# Patient Record
Sex: Female | Born: 1960 | Race: White | Hispanic: No | Smoking: Never smoker
Health system: Southern US, Community
[De-identification: ages and names within clinical notes are randomized; demographics above are authoritative.]

---

## 1999-10-25 ENCOUNTER — Encounter: Payer: Self-pay | Admitting: Obstetrics & Gynecology

## 1999-10-25 ENCOUNTER — Encounter: Admission: RE | Admit: 1999-10-25 | Discharge: 1999-10-25 | Payer: Self-pay | Admitting: Obstetrics & Gynecology

## 1999-12-19 ENCOUNTER — Other Ambulatory Visit: Admission: RE | Admit: 1999-12-19 | Discharge: 1999-12-19 | Payer: Self-pay | Admitting: Obstetrics & Gynecology

## 2001-08-27 ENCOUNTER — Encounter: Payer: Self-pay | Admitting: Family Medicine

## 2001-08-27 ENCOUNTER — Encounter: Admission: RE | Admit: 2001-08-27 | Discharge: 2001-08-27 | Payer: Self-pay | Admitting: Family Medicine

## 2004-07-06 ENCOUNTER — Ambulatory Visit: Payer: Self-pay | Admitting: Family Medicine

## 2004-12-21 ENCOUNTER — Ambulatory Visit: Payer: Self-pay | Admitting: Internal Medicine

## 2005-01-10 ENCOUNTER — Ambulatory Visit: Payer: Self-pay | Admitting: Family Medicine

## 2005-01-30 ENCOUNTER — Ambulatory Visit: Payer: Self-pay | Admitting: Family Medicine

## 2006-09-15 ENCOUNTER — Other Ambulatory Visit: Payer: Self-pay

## 2006-09-15 ENCOUNTER — Emergency Department: Payer: Self-pay | Admitting: Emergency Medicine

## 2006-12-26 ENCOUNTER — Telehealth (INDEPENDENT_AMBULATORY_CARE_PROVIDER_SITE_OTHER): Payer: Self-pay | Admitting: *Deleted

## 2007-12-23 ENCOUNTER — Telehealth (INDEPENDENT_AMBULATORY_CARE_PROVIDER_SITE_OTHER): Payer: Self-pay | Admitting: *Deleted

## 2010-01-23 ENCOUNTER — Ambulatory Visit (HOSPITAL_COMMUNITY)
Admission: RE | Admit: 2010-01-23 | Discharge: 2010-01-23 | Payer: Self-pay | Source: Home / Self Care | Attending: Internal Medicine | Admitting: Internal Medicine

## 2011-02-01 ENCOUNTER — Other Ambulatory Visit (HOSPITAL_COMMUNITY): Payer: Self-pay | Admitting: Family Medicine

## 2011-02-01 DIAGNOSIS — Z1231 Encounter for screening mammogram for malignant neoplasm of breast: Secondary | ICD-10-CM

## 2011-03-05 ENCOUNTER — Ambulatory Visit (HOSPITAL_COMMUNITY)
Admission: RE | Admit: 2011-03-05 | Discharge: 2011-03-05 | Disposition: A | Discharge status: Home / Self Care | Payer: Self-pay | Source: Ambulatory Visit | Attending: Family Medicine | Admitting: Family Medicine

## 2011-03-05 ENCOUNTER — Other Ambulatory Visit (HOSPITAL_COMMUNITY): Payer: Self-pay | Admitting: Internal Medicine

## 2011-03-05 DIAGNOSIS — Z1231 Encounter for screening mammogram for malignant neoplasm of breast: Secondary | ICD-10-CM

## 2012-02-20 ENCOUNTER — Emergency Department (HOSPITAL_COMMUNITY)
Admission: EM | Admit: 2012-02-20 | Discharge: 2012-02-20 | Disposition: A | Discharge status: Home / Self Care | Payer: Self-pay | Source: Home / Self Care | Attending: Family Medicine | Admitting: Family Medicine

## 2012-02-20 ENCOUNTER — Encounter (HOSPITAL_COMMUNITY): Payer: Self-pay

## 2012-02-20 DIAGNOSIS — R002 Palpitations: Secondary | ICD-10-CM

## 2012-02-20 DIAGNOSIS — M62838 Other muscle spasm: Secondary | ICD-10-CM

## 2012-02-20 DIAGNOSIS — M1990 Unspecified osteoarthritis, unspecified site: Secondary | ICD-10-CM

## 2012-02-20 DIAGNOSIS — I10 Essential (primary) hypertension: Secondary | ICD-10-CM

## 2012-02-20 DIAGNOSIS — Z23 Encounter for immunization: Secondary | ICD-10-CM

## 2012-02-20 LAB — COMPREHENSIVE METABOLIC PANEL
Albumin: 3.9 g/dL (ref 3.5–5.2)
BUN: 13 mg/dL (ref 6–23)
Creatinine, Ser: 0.64 mg/dL (ref 0.50–1.10)
GFR calc Af Amer: >90 mL/min (ref >90)
Glucose, Bld: 88 mg/dL (ref 70–99)
Total Bilirubin: 0.4 mg/dL (ref 0.3–1.2)
Total Protein: 7.2 g/dL (ref 6.0–8.3)

## 2012-02-20 LAB — CBC
Platelets: 230 10*3/uL (ref 150–400)
RDW: 13.3 % (ref 11.5–15.5)
WBC: 8.3 10*3/uL (ref 4.0–10.5)

## 2012-02-20 LAB — SEDIMENTATION RATE: Sed Rate: 27 mm/hr — ABNORMAL HIGH (ref 0–22)

## 2012-02-20 LAB — POCT URINALYSIS DIP (DEVICE)
Glucose, UA: NEGATIVE mg/dL
Ketones, ur: 15 mg/dL — AB
Leukocytes, UA: NEGATIVE
Specific Gravity, Urine: 1.01 (ref 1.005–1.030)
Urobilinogen, UA: 0.2 mg/dL (ref 0.0–1.0)

## 2012-02-20 LAB — URIC ACID: Uric Acid, Serum: 4.6 mg/dL (ref 2.4–7.0)

## 2012-02-20 LAB — TSH: TSH: 0.599 u[IU]/mL (ref 0.350–4.500)

## 2012-02-20 LAB — HEMOGLOBIN A1C: Mean Plasma Glucose: 111 mg/dL (ref <117)

## 2012-02-20 MED ORDER — INFLUENZA VIRUS VACC SPLIT PF IM SUSP
0.5000 mL | Freq: Once | INTRAMUSCULAR | Status: AC
  Administered 2012-02-20: 0.5 mL via INTRAMUSCULAR

## 2012-02-20 MED ORDER — MELOXICAM 15 MG PO TABS: 15.0000 mg | ORAL_TABLET | Freq: Every day | ORAL | Status: DC

## 2012-02-20 MED ORDER — HYDROCHLOROTHIAZIDE 12.5 MG PO TABS: 12.5000 mg | ORAL_TABLET | Freq: Every day | ORAL | Status: AC

## 2012-02-20 MED ORDER — NORGESTIMATE-ETH ESTRADIOL 0.25-35 MG-MCG PO TABS: 1.0000 | ORAL_TABLET | Freq: Every day | ORAL | Status: AC

## 2012-02-20 NOTE — ED Notes (Signed)
Patient states has pain in both hands for past two months. Has been taking meloxicam but makes her feel bloated, feels like she is gaining weight from medication

## 2012-02-20 NOTE — ED Provider Notes (Signed)
History   CSN: [id number] Arrival date & time 02/20/12  1215  First MD Initiated Contact with Patient 02/20/12 1301     Chief Complaint  Patient presents with  . Hand Pain   HPI Pt reports that she has been having some PALPATATIONS.   Pt says that she needs refills of her Sprintec OCPs.   She takes them for regulating menstrual cycles.  She will see her GYN in a couple of months.  She reports she has been having palpatations regularly lately.  She had an ECG with her PCP at [facility] recently reported as normal.  Symptoms persist.  Pt has had arthritis in both hands and nodules have developed and have been painful.  Pt would like to have her arthritis labs checked because she could not afford to have them checked with her PCP.   Pt is taking meloxicam and reports that she is able to function better on the medication but just takes the edge off of her symptoms of arthritis pain.     History reviewed. No pertinent past medical history.  History reviewed. No pertinent past surgical history.  No family history on file.  History  Substance Use Topics  . Smoking status: Never Smoker   . Smokeless tobacco: Not on file  . Alcohol Use: No    OB History    Grav Para Term Preterm Abortions TAB SAB Ect Mult Living                 Review of Systems  Cardiovascular: Positive for palpitations.  Genitourinary: Positive for urgency and frequency.  Musculoskeletal: Positive for joint swelling and arthralgias.  All other systems reviewed and are negative.   Allergies  Review of patient's allergies indicates no known allergies.  Home Medications   Current Outpatient Rx  Name  Route  Sig  Dispense  Refill  . MELOXICAM 15 MG PO TABS   Oral   Take 15 mg by mouth daily.          BP 146/72  Pulse 53  Temp 98.1 F (36.7 C) (Oral)  Resp 18  SpO2 99%  Physical Exam  Nursing note and vitals reviewed. Constitutional: She is oriented to person, place, and time. She appears  well-developed and well-nourished. No distress.  HENT:  Head: Normocephalic and atraumatic.  Eyes: EOM are normal. Pupils are equal, round, and reactive to light.  Neck: Normal range of motion. Neck supple. No thyromegaly present.  Cardiovascular: Normal rate, regular rhythm, normal heart sounds and intact distal pulses.  Exam reveals no gallop and no friction rub.   No murmur heard. Pulmonary/Chest: Effort normal and breath sounds normal. No respiratory distress. She has no wheezes.  Abdominal: Soft. Bowel sounds are normal. She exhibits no distension.  Musculoskeletal: She exhibits edema and tenderness.       Left hand: She exhibits tenderness, deformity and swelling. normal sensation noted. Normal strength noted.       Hands: Lymphadenopathy:    She has no cervical adenopathy.  Neurological: She is alert and oriented to person, place, and time.  Skin: Skin is warm and dry. No rash noted. No erythema.  Psychiatric: She has a normal mood and affect. Her behavior is normal. Judgment and thought content normal.    ED Course  Procedures (including critical care time)  Labs Reviewed - No data to display No results found.  No diagnosis found.  MDM  IMPRESSION  palpitations  Osteoarthritis hands  Hypertension  Dysuria (urgency  and frequency)  RECOMMENDATIONS / PLAN HCTZ 12.5 mg po daily Check labs including TSH, ESR, CMP, A1c, check ECG today Check urinalysis  Check ECG Follow Labs Continue Mobic 15 mg po daily  Counseling and education provided Refilled her Sprintec prescription but advised her to make sure and follow up with her gynecologist in the next month as she has scheduled Recheck blood pressure at home 1-2 times daily right down readings and bring to next office visit.  Call us with blood pressure readings in 2 weeks.  FOLLOW UP 2 months   The patient was given clear instructions to go to ER or return to medical center if symptoms don't improve, worsen or new  problems develop.  The patient verbalized understanding.  The patient was told to call to get lab results if they haven't heard anything in the next week.            Cleora Fleet, MD 02/20/12 1436

## 2012-02-21 NOTE — Progress Notes (Signed)
Quick Note:  Please notify patient that her labs came back okay.    Rodney Langton, MD, CDE, FAAFP [facility] [place], [place]   ______

## 2012-02-24 ENCOUNTER — Other Ambulatory Visit (HOSPITAL_COMMUNITY): Payer: Self-pay | Admitting: Internal Medicine

## 2012-02-24 DIAGNOSIS — Z1231 Encounter for screening mammogram for malignant neoplasm of breast: Secondary | ICD-10-CM

## 2012-02-25 ENCOUNTER — Telehealth (HOSPITAL_COMMUNITY): Payer: Self-pay

## 2012-02-25 NOTE — Telephone Encounter (Signed)
Message copied by Lestine Mount on Tue Feb 25, 2012 11:07 AM ------      Message from: Cleora Fleet      Created: Fri Feb 21, 2012  9:51 AM       Please notify patient that her labs came back okay.                        Rodney Langton, MD, CDE, FAAFP      [facility]      [facility]      [place], [place]

## 2012-03-10 ENCOUNTER — Encounter (HOSPITAL_COMMUNITY): Payer: Self-pay

## 2012-03-10 ENCOUNTER — Ambulatory Visit (HOSPITAL_COMMUNITY)
Admission: RE | Admit: 2012-03-10 | Discharge: 2012-03-10 | Disposition: A | Discharge status: Home / Self Care | Payer: Self-pay | Source: Ambulatory Visit | Attending: Internal Medicine | Admitting: Internal Medicine

## 2012-03-10 ENCOUNTER — Emergency Department (HOSPITAL_COMMUNITY)
Admission: EM | Admit: 2012-03-10 | Discharge: 2012-03-10 | Disposition: A | Discharge status: Home / Self Care | Payer: Self-pay | Source: Home / Self Care | Attending: Family Medicine | Admitting: Family Medicine

## 2012-03-10 DIAGNOSIS — J309 Allergic rhinitis, unspecified: Secondary | ICD-10-CM

## 2012-03-10 DIAGNOSIS — M1990 Unspecified osteoarthritis, unspecified site: Secondary | ICD-10-CM

## 2012-03-10 DIAGNOSIS — Z1231 Encounter for screening mammogram for malignant neoplasm of breast: Secondary | ICD-10-CM | POA: Insufficient documentation

## 2012-03-10 DIAGNOSIS — M2550 Pain in unspecified joint: Secondary | ICD-10-CM

## 2012-03-10 DIAGNOSIS — I10 Essential (primary) hypertension: Secondary | ICD-10-CM

## 2012-03-10 MED ORDER — OMEPRAZOLE 20 MG PO TBEC: 1.0000 | DELAYED_RELEASE_TABLET | Freq: Every morning | ORAL | Status: AC

## 2012-03-10 MED ORDER — LORATADINE 10 MG PO TABS: 10.0000 mg | ORAL_TABLET | Freq: Every day | ORAL | Status: AC

## 2012-03-10 MED ORDER — MELOXICAM 7.5 MG PO TABS: 7.5000 mg | ORAL_TABLET | Freq: Every day | ORAL | Status: AC

## 2012-03-10 NOTE — ED Notes (Signed)
Patient states here for follow up needs referral to a specialist

## 2012-03-10 NOTE — ED Provider Notes (Signed)
History   CSN: [id number]  Arrival date & time 03/10/12  1244  First MD Initiated Contact with Patient 03/10/12 1309     Chief Complaint  Patient presents with  . Follow-up   HPI Pt presenting to follow up for her arthritis condition.   She says that she stopped taking the mobic for about 4 days and she had a terrible exacerbation of her joint swelling and pain in the hands.  The patient says that she is noticing that she is getting more and more ulcers in the mouth and reports that she is having some abdominal discomfort but has improved with time. She is requesting that she be evaluated by a rheumatologist. She says she will have [employer] next month and would like to have a referral to have this evaluated.   History reviewed. No pertinent past medical history.  History reviewed. No pertinent past surgical history.  No family history on file.  History  Substance Use Topics  . Smoking status: Never Smoker   . Smokeless tobacco: Not on file  . Alcohol Use: No    OB History   Grav Para Term Preterm Abortions TAB SAB Ect Mult Living                 Review of Systems  Gastrointestinal: Positive for nausea. Negative for abdominal pain, blood in stool and anal bleeding.  Musculoskeletal: Positive for joint swelling and arthralgias.  All other systems reviewed and are negative.    Allergies  Review of patient's allergies indicates no known allergies.  Home Medications   Current Outpatient Rx  Name  Route  Sig  Dispense  Refill  . hydrochlorothiazide (HYDRODIURIL) 12.5 MG tablet   Oral   Take 1 tablet (12.5 mg total) by mouth daily.   30 tablet   3   . loratadine (CLARITIN) 10 MG tablet   Oral   Take 1 tablet (10 mg total) by mouth daily.   30 tablet   1   . meloxicam (MOBIC) 7.5 MG tablet   Oral   Take 1 tablet (7.5 mg total) by mouth daily.   30 tablet   2   . norgestimate-ethinyl estradiol (ORTHO-CYCLEN,SPRINTEC,PREVIFEM) 0.25-35 MG-MCG tablet  Oral   Take 1 tablet by mouth daily.   1 Package   2   . Omeprazole 20 MG TBEC   Oral   Take 1 tablet (20 mg total) by mouth every morning.   30 each   3     BP 124/49  Pulse 66  Temp(Src) 97.7 F (36.5 C) (Oral)  SpO2 99%  LMP 02/25/2012  Physical Exam  Nursing note and vitals reviewed. Constitutional: She is oriented to person, place, and time. She appears well-developed and well-nourished. No distress.  HENT:  Head: Normocephalic and atraumatic.  Right Ear: External ear normal.  Left Ear: External ear normal.  Nose: Mucosal edema present.   Several apthous ulcers seen in mouth on post pharynx   Eyes: EOM are normal. Pupils are equal, round, and reactive to light.  Neck: Normal range of motion. Neck supple.  Cardiovascular: Normal rate, regular rhythm and normal heart sounds.   Pulmonary/Chest: Effort normal.  Abdominal: Soft. Bowel sounds are normal.  Musculoskeletal: Normal range of motion. She exhibits tenderness.  Tender PIP joints in fingers both hands Edema appears less than previous exam  Neurological: She is alert and oriented to person, place, and time.  Skin: Skin is warm and dry.  Psychiatric: She has a  normal mood and affect. Her behavior is normal. Judgment and thought content normal.    ED Course  Procedures (including critical care time)  Labs Reviewed - No data to display No results found.  1. Osteoarthritis   2. Joint pain   3. Hypertension   4. Allergic rhinitis    MDM  RECOMMENDATIONS / PLAN Referral to rheumatologist for evaluation Reduce dose of mobic to 7.5 mg po daily, hopefully this dose will be effective in controlling her symptoms and minimize the side effects Added omeprazole 20 mg po daily for GI protection Precautions given to patient.  I asked her to add acetaminophen to her regimen to help control her symptoms of pain and swelling.  May have to increase dose to 15 mg to help control symptoms if the 7.5 mg is not effective.    If pt develops black stools or hematemesis or other GI symptoms, I advised her to go to ER.  Pt verbalized understanding.    FOLLOW UP 2 months  The patient was given clear instructions to go to ER or return to medical center if symptoms don't improve, worsen or new problems develop.  The patient verbalized understanding.  The patient was told to call to get lab results if they haven't heard anything in the next week.            Cleora Fleet, MD 03/10/12 1422

## 2012-03-11 MED ORDER — LIDOCAINE VISCOUS 2 % MT SOLN: OROMUCOSAL | Status: AC

## 2012-03-11 NOTE — Progress Notes (Signed)
Pt called requesting something for the oral ulcer pain in mouth.  Will e-prescribe viscous lidocaine to use prn for sore mouth from oral ulcers.    Maryln Manuel, MD

## 2012-03-18 NOTE — ED Notes (Signed)
Referral faxed to rheumatologist-arthritis of the hands-waiting for an appt

## 2012-06-17 ENCOUNTER — Telehealth: Payer: Self-pay | Admitting: General Practice

## 2012-07-03 ENCOUNTER — Ambulatory Visit: Payer: Self-pay

## 2012-07-15 ENCOUNTER — Ambulatory Visit (HOSPITAL_COMMUNITY)
Admission: RE | Admit: 2012-07-15 | Discharge: 2012-07-15 | Disposition: A | Discharge status: Home / Self Care | Payer: BC Managed Care – PPO | Source: Ambulatory Visit | Attending: Internal Medicine | Admitting: Internal Medicine

## 2012-07-15 ENCOUNTER — Other Ambulatory Visit (HOSPITAL_COMMUNITY): Payer: Self-pay | Admitting: Internal Medicine

## 2012-07-15 DIAGNOSIS — R52 Pain, unspecified: Secondary | ICD-10-CM

## 2012-07-15 DIAGNOSIS — M25519 Pain in unspecified shoulder: Secondary | ICD-10-CM | POA: Insufficient documentation

## 2012-07-15 DIAGNOSIS — M5030 Other cervical disc degeneration, unspecified cervical region: Secondary | ICD-10-CM | POA: Insufficient documentation

## 2012-07-15 DIAGNOSIS — M47812 Spondylosis without myelopathy or radiculopathy, cervical region: Secondary | ICD-10-CM | POA: Insufficient documentation

## 2013-01-04 ENCOUNTER — Emergency Department: Payer: Self-pay | Admitting: Emergency Medicine

## 2013-01-04 LAB — URINALYSIS, COMPLETE
Bilirubin,UR: NEGATIVE
Leukocyte Esterase: NEGATIVE
Nitrite: NEGATIVE

## 2013-06-15 ENCOUNTER — Ambulatory Visit: Payer: Self-pay | Admitting: Rheumatology

## 2013-07-29 ENCOUNTER — Other Ambulatory Visit: Payer: Self-pay | Admitting: Internal Medicine

## 2013-07-29 DIAGNOSIS — Z1231 Encounter for screening mammogram for malignant neoplasm of breast: Secondary | ICD-10-CM

## 2013-08-02 ENCOUNTER — Other Ambulatory Visit (HOSPITAL_COMMUNITY): Payer: Self-pay | Admitting: Internal Medicine

## 2013-08-02 DIAGNOSIS — Z1231 Encounter for screening mammogram for malignant neoplasm of breast: Secondary | ICD-10-CM

## 2013-08-11 ENCOUNTER — Ambulatory Visit (HOSPITAL_COMMUNITY): Payer: BC Managed Care – PPO

## 2013-08-11 ENCOUNTER — Ambulatory Visit: Payer: BC Managed Care – PPO

## 2013-08-12 ENCOUNTER — Ambulatory Visit (HOSPITAL_COMMUNITY)
Admission: RE | Admit: 2013-08-12 | Discharge: 2013-08-12 | Disposition: A | Discharge status: Home / Self Care | Payer: BC Managed Care – PPO | Source: Ambulatory Visit | Attending: Internal Medicine | Admitting: Internal Medicine

## 2013-08-12 ENCOUNTER — Ambulatory Visit (HOSPITAL_COMMUNITY): Payer: BC Managed Care – PPO

## 2013-08-12 DIAGNOSIS — Z1231 Encounter for screening mammogram for malignant neoplasm of breast: Secondary | ICD-10-CM

## 2014-05-07 ENCOUNTER — Ambulatory Visit
Admit: 2014-05-07 | Disposition: A | Discharge status: Home / Self Care | Payer: Self-pay | Attending: Rheumatology | Admitting: Rheumatology

## 2014-10-17 ENCOUNTER — Other Ambulatory Visit (HOSPITAL_COMMUNITY): Payer: Self-pay | Admitting: Internal Medicine

## 2014-10-17 DIAGNOSIS — Z1231 Encounter for screening mammogram for malignant neoplasm of breast: Secondary | ICD-10-CM

## 2014-10-21 ENCOUNTER — Ambulatory Visit (HOSPITAL_COMMUNITY)
Admission: RE | Admit: 2014-10-21 | Discharge: 2014-10-21 | Disposition: A | Discharge status: Home / Self Care | Payer: 59 | Source: Ambulatory Visit | Attending: Internal Medicine | Admitting: Internal Medicine

## 2014-10-21 DIAGNOSIS — Z1231 Encounter for screening mammogram for malignant neoplasm of breast: Secondary | ICD-10-CM | POA: Diagnosis not present

## 2015-04-11 ENCOUNTER — Other Ambulatory Visit: Payer: Self-pay | Admitting: Gastroenterology

## 2015-04-11 DIAGNOSIS — R110 Nausea: Secondary | ICD-10-CM

## 2015-04-11 DIAGNOSIS — R1013 Epigastric pain: Secondary | ICD-10-CM

## 2015-04-26 ENCOUNTER — Ambulatory Visit (HOSPITAL_COMMUNITY)
Admission: RE | Admit: 2015-04-26 | Discharge: 2015-04-26 | Disposition: A | Discharge status: Home / Self Care | Payer: BLUE CROSS/BLUE SHIELD | Source: Ambulatory Visit | Attending: Gastroenterology | Admitting: Gastroenterology

## 2015-04-26 DIAGNOSIS — R110 Nausea: Secondary | ICD-10-CM | POA: Insufficient documentation

## 2015-04-26 DIAGNOSIS — N1330 Unspecified hydronephrosis: Secondary | ICD-10-CM | POA: Diagnosis not present

## 2015-04-26 DIAGNOSIS — K7689 Other specified diseases of liver: Secondary | ICD-10-CM | POA: Diagnosis not present

## 2015-04-26 DIAGNOSIS — R1013 Epigastric pain: Secondary | ICD-10-CM

## 2015-04-26 MED ORDER — TECHNETIUM TC 99M MEBROFENIN IV KIT
5.0000 | PACK | Freq: Once | INTRAVENOUS | Status: AC | PRN
  Administered 2015-04-26: 5 via INTRAVENOUS

## 2015-07-23 ENCOUNTER — Encounter (HOSPITAL_COMMUNITY): Payer: Self-pay | Admitting: *Deleted

## 2015-07-23 ENCOUNTER — Emergency Department (HOSPITAL_COMMUNITY)
Admission: EM | Admit: 2015-07-23 | Discharge: 2015-07-24 | Disposition: A | Discharge status: Home / Self Care | Payer: BLUE CROSS/BLUE SHIELD | Attending: Dermatology | Admitting: Dermatology

## 2015-07-23 DIAGNOSIS — Y929 Unspecified place or not applicable: Secondary | ICD-10-CM | POA: Insufficient documentation

## 2015-07-23 DIAGNOSIS — Y999 Unspecified external cause status: Secondary | ICD-10-CM | POA: Insufficient documentation

## 2015-07-23 DIAGNOSIS — Y939 Activity, unspecified: Secondary | ICD-10-CM | POA: Diagnosis not present

## 2015-07-23 DIAGNOSIS — W57XXXA Bitten or stung by nonvenomous insect and other nonvenomous arthropods, initial encounter: Secondary | ICD-10-CM | POA: Insufficient documentation

## 2015-07-23 DIAGNOSIS — S50862A Insect bite (nonvenomous) of left forearm, initial encounter: Secondary | ICD-10-CM | POA: Diagnosis present

## 2015-07-23 DIAGNOSIS — Z5321 Procedure and treatment not carried out due to patient leaving prior to being seen by health care provider: Secondary | ICD-10-CM | POA: Diagnosis not present

## 2015-07-23 NOTE — ED Notes (Signed)
The pt is c/o a ?? Insect bite  She thinks  Yesterday while moving furniture.  Lesion lt forearm sl redness and swelling today.  lmp none

## 2015-07-24 NOTE — ED Notes (Signed)
Pt was called to exam room, Pt did not answer

## 2015-07-24 NOTE — ED Notes (Signed)
Pt called x2 no answer 

## 2015-09-27 ENCOUNTER — Other Ambulatory Visit: Payer: Self-pay | Admitting: Internal Medicine

## 2015-09-27 DIAGNOSIS — Z1231 Encounter for screening mammogram for malignant neoplasm of breast: Secondary | ICD-10-CM

## 2015-10-27 ENCOUNTER — Ambulatory Visit: Payer: BLUE CROSS/BLUE SHIELD

## 2015-10-30 ENCOUNTER — Ambulatory Visit
Admission: RE | Admit: 2015-10-30 | Discharge: 2015-10-30 | Disposition: A | Discharge status: Home / Self Care | Payer: BLUE CROSS/BLUE SHIELD | Source: Ambulatory Visit | Attending: Internal Medicine | Admitting: Internal Medicine

## 2015-10-30 DIAGNOSIS — Z1231 Encounter for screening mammogram for malignant neoplasm of breast: Secondary | ICD-10-CM

## 2016-01-27 ENCOUNTER — Encounter (HOSPITAL_COMMUNITY): Payer: Self-pay | Admitting: Emergency Medicine

## 2016-01-27 ENCOUNTER — Ambulatory Visit (HOSPITAL_COMMUNITY)
Admission: EM | Admit: 2016-01-27 | Discharge: 2016-01-27 | Disposition: A | Discharge status: Home / Self Care | Payer: BLUE CROSS/BLUE SHIELD | Attending: Emergency Medicine | Admitting: Emergency Medicine

## 2016-01-27 DIAGNOSIS — J0110 Acute frontal sinusitis, unspecified: Secondary | ICD-10-CM | POA: Diagnosis not present

## 2016-01-27 MED ORDER — FLUTICASONE PROPIONATE 50 MCG/ACT NA SUSP: 1.0000 | Freq: Every day | NASAL | 2 refills | Status: AC

## 2016-01-27 MED ORDER — BENZONATATE 100 MG PO CAPS: 100.0000 mg | ORAL_CAPSULE | Freq: Three times a day (TID) | ORAL | 0 refills | Status: AC

## 2016-01-27 MED ORDER — AMOXICILLIN-POT CLAVULANATE 875-125 MG PO TABS: 1.0000 | ORAL_TABLET | Freq: Two times a day (BID) | ORAL | 0 refills | Status: DC

## 2016-01-27 MED ORDER — AMOXICILLIN-POT CLAVULANATE 875-125 MG PO TABS: 1.0000 | ORAL_TABLET | Freq: Two times a day (BID) | ORAL | 0 refills | Status: AC

## 2016-01-27 MED ORDER — FLUTICASONE PROPIONATE 50 MCG/ACT NA SUSP: 1.0000 | Freq: Every day | NASAL | 2 refills | Status: DC

## 2016-01-27 MED ORDER — BENZONATATE 100 MG PO CAPS: 100.0000 mg | ORAL_CAPSULE | Freq: Three times a day (TID) | ORAL | 0 refills | Status: DC

## 2016-01-27 NOTE — ED Triage Notes (Signed)
Here for cold sx onset 3 days associated w/HA, prod cough, PND, nasal congsetion  Denies fevers  Has not tried any meds  A&O x4... NAD

## 2016-01-27 NOTE — Discharge Instructions (Signed)
Continue to push fluids

## 2016-01-27 NOTE — ED Provider Notes (Signed)
CSN: [id number]     Arrival date & time 01/27/16  1209 History   None    Chief Complaint  Patient presents with  . URI   (Consider location/radiation/quality/duration/timing/severity/associated sxs/prior Treatment) 55 y.o. female presents with congestion, headache, fatigue and cough  X 3 days. Condition is acute in nature. Condition is made better by nothing. Condition is made worse by bending over and with palpation. Patient denies any relief from alka seltzer prior to there arrival at this facility. Patient denies any fevers, generalized aches and pain.       History reviewed. No pertinent past medical history. History reviewed. No pertinent surgical history. No family history on file. Social History  Substance Use Topics  . Smoking status: Never Smoker  . Smokeless tobacco: Never Used  . Alcohol use No   OB History    No data available     Review of Systems  Constitutional: Positive for fatigue. Negative for chills and fever.  HENT: Positive for congestion and sinus pain. Negative for ear pain and sore throat.   Eyes: Negative for pain and visual disturbance.  Respiratory: Positive for cough ( non productive). Negative for shortness of breath.   Cardiovascular: Negative for chest pain and palpitations.  Gastrointestinal: Negative for abdominal pain and vomiting.  Genitourinary: Negative for dysuria and hematuria.  Musculoskeletal: Negative for arthralgias and back pain.  Skin: Negative for color change and rash.  Neurological: Negative for seizures and syncope.  All other systems reviewed and are negative.   Allergies  Patient has no known allergies.  Home Medications   Prior to Admission medications   Medication Sig Start Date End Date Taking? Authorizing Provider  hydrochlorothiazide (HYDRODIURIL) 12.5 MG tablet Take 1 tablet (12.5 mg total) by mouth daily. 02/20/12  Yes Clanford Cyndie MullL Johnson, MD  hydroxychloroquine (PLAQUENIL) 200 MG tablet Take by mouth daily.    Yes Historical Provider, MD  meloxicam (MOBIC) 7.5 MG tablet Take 1 tablet (7.5 mg total) by mouth daily. 03/10/12  Yes Clanford Cyndie MullL Johnson, MD  Omeprazole 20 MG TBEC Take 1 tablet (20 mg total) by mouth every morning. 03/10/12  Yes Clanford L Johnson, MD  zolpidem (AMBIEN) 5 MG tablet Take 5 mg by mouth at bedtime as needed for sleep.   Yes Historical Provider, MD  amoxicillin-clavulanate (AUGMENTIN) 875-125 MG tablet Take 1 tablet by mouth every 12 (twelve) hours. 01/27/16 02/01/16  Alene MiresJennifer C Amaura Authier, NP  benzonatate (TESSALON) 100 MG capsule Take 1 capsule (100 mg total) by mouth every 8 (eight) hours. 01/27/16   Alene MiresJennifer C Shalece Staffa, NP  fluticasone (FLONASE) 50 MCG/ACT nasal spray Place 1 spray into both nostrils daily. 01/27/16   Alene MiresJennifer C Kynslee Baham, NP  lidocaine (XYLOCAINE) 2 % solution Take 20 mL orally and swish in mouth and spit out PRN ulcer pain, Do not swallow 03/11/12   Clanford Cyndie MullL Johnson, MD  loratadine (CLARITIN) 10 MG tablet Take 1 tablet (10 mg total) by mouth daily. 03/10/12   Clanford Cyndie MullL Johnson, MD  norgestimate-ethinyl estradiol (ORTHO-CYCLEN,SPRINTEC,PREVIFEM) 0.25-35 MG-MCG tablet Take 1 tablet by mouth daily. 02/20/12   Clanford Cyndie MullL Johnson, MD   Meds Ordered and Administered this Visit  Medications - No data to display  BP 137/79 (BP Location: Right Arm)   Pulse 71   Temp 98.7 F (37.1 C) (Oral)   Resp 16   LMP 05/20/2012   SpO2 98%  No data found.   Physical Exam  Constitutional: She is oriented to person, place, and time. She appears well-developed  and well-nourished.  HENT:  Head: Normocephalic and atraumatic.  Pain with palpation to frontal sinuses  Eyes: Conjunctivae are normal.  Neck: Normal range of motion.  Cardiovascular: Normal rate and regular rhythm.   Pulmonary/Chest: Effort normal and breath sounds normal.  Musculoskeletal: Normal range of motion.  Neurological: She is alert and oriented to person, place, and time.  Skin: Skin is warm.   Psychiatric: She has a normal mood and affect.  Nursing note and vitals reviewed.   Urgent Care Course   Clinical Course     Procedures (including critical care time)  Labs Review Labs Reviewed - No data to display  Imaging Review No results found.      MDM   1. Acute frontal sinusitis, recurrence not specified       Alene MiresJennifer C Giliana Vantil, NP 01/27/16 1336

## 2016-11-13 ENCOUNTER — Other Ambulatory Visit: Payer: Self-pay | Admitting: Internal Medicine

## 2016-11-13 DIAGNOSIS — Z1231 Encounter for screening mammogram for malignant neoplasm of breast: Secondary | ICD-10-CM

## 2016-11-19 ENCOUNTER — Ambulatory Visit
Admission: RE | Admit: 2016-11-19 | Discharge: 2016-11-19 | Disposition: A | Discharge status: Home / Self Care | Payer: BLUE CROSS/BLUE SHIELD | Source: Ambulatory Visit | Attending: Internal Medicine | Admitting: Internal Medicine

## 2016-11-19 DIAGNOSIS — Z1231 Encounter for screening mammogram for malignant neoplasm of breast: Secondary | ICD-10-CM

## 2017-12-03 ENCOUNTER — Other Ambulatory Visit: Payer: Self-pay | Admitting: Internal Medicine

## 2017-12-03 DIAGNOSIS — Z1231 Encounter for screening mammogram for malignant neoplasm of breast: Secondary | ICD-10-CM

## 2018-01-26 ENCOUNTER — Ambulatory Visit
Admission: RE | Admit: 2018-01-26 | Discharge: 2018-01-26 | Disposition: A | Discharge status: Home / Self Care | Payer: BC Managed Care – PPO | Source: Ambulatory Visit | Attending: Internal Medicine | Admitting: Internal Medicine

## 2018-01-26 DIAGNOSIS — Z1231 Encounter for screening mammogram for malignant neoplasm of breast: Secondary | ICD-10-CM

## 2018-02-06 ENCOUNTER — Ambulatory Visit: Payer: Self-pay

## 2018-02-06 ENCOUNTER — Other Ambulatory Visit: Payer: Self-pay | Admitting: Occupational Medicine

## 2018-02-06 DIAGNOSIS — M542 Cervicalgia: Secondary | ICD-10-CM

## 2018-11-02 ENCOUNTER — Other Ambulatory Visit: Payer: Self-pay | Admitting: Neurosurgery

## 2018-11-02 DIAGNOSIS — M5416 Radiculopathy, lumbar region: Secondary | ICD-10-CM

## 2018-11-07 ENCOUNTER — Ambulatory Visit
Admission: RE | Admit: 2018-11-07 | Discharge: 2018-11-07 | Disposition: A | Discharge status: Home / Self Care | Payer: BC Managed Care – PPO | Source: Ambulatory Visit | Attending: Neurosurgery | Admitting: Neurosurgery

## 2018-11-07 ENCOUNTER — Other Ambulatory Visit: Payer: Self-pay

## 2018-11-07 DIAGNOSIS — M5416 Radiculopathy, lumbar region: Secondary | ICD-10-CM

## 2018-11-12 ENCOUNTER — Other Ambulatory Visit: Payer: BC Managed Care – PPO

## 2018-12-14 ENCOUNTER — Other Ambulatory Visit: Payer: Self-pay | Admitting: Internal Medicine

## 2018-12-14 DIAGNOSIS — E559 Vitamin D deficiency, unspecified: Secondary | ICD-10-CM

## 2018-12-14 DIAGNOSIS — Z1231 Encounter for screening mammogram for malignant neoplasm of breast: Secondary | ICD-10-CM

## 2018-12-15 ENCOUNTER — Other Ambulatory Visit: Payer: BC Managed Care – PPO

## 2018-12-29 ENCOUNTER — Other Ambulatory Visit: Payer: Self-pay

## 2018-12-29 DIAGNOSIS — Z20822 Contact with and (suspected) exposure to covid-19: Secondary | ICD-10-CM

## 2018-12-31 LAB — NOVEL CORONAVIRUS, NAA: SARS-CoV-2, NAA: NOT DETECTED

## 2019-01-01 ENCOUNTER — Telehealth: Payer: Self-pay | Admitting: *Deleted

## 2019-01-01 NOTE — Telephone Encounter (Signed)
Patient called and given negative covid results . 

## 2019-01-06 ENCOUNTER — Ambulatory Visit
Admission: RE | Admit: 2019-01-06 | Discharge: 2019-01-06 | Disposition: A | Discharge status: Home / Self Care | Payer: BC Managed Care – PPO | Source: Ambulatory Visit | Attending: Internal Medicine | Admitting: Internal Medicine

## 2019-01-06 ENCOUNTER — Other Ambulatory Visit: Payer: Self-pay

## 2019-01-06 DIAGNOSIS — E559 Vitamin D deficiency, unspecified: Secondary | ICD-10-CM

## 2019-02-05 ENCOUNTER — Other Ambulatory Visit: Payer: Self-pay

## 2019-02-05 ENCOUNTER — Ambulatory Visit
Admission: RE | Admit: 2019-02-05 | Discharge: 2019-02-05 | Disposition: A | Discharge status: Home / Self Care | Payer: BC Managed Care – PPO | Source: Ambulatory Visit | Attending: Internal Medicine | Admitting: Internal Medicine

## 2019-02-05 DIAGNOSIS — Z1231 Encounter for screening mammogram for malignant neoplasm of breast: Secondary | ICD-10-CM

## 2019-02-09 ENCOUNTER — Other Ambulatory Visit: Payer: Self-pay | Admitting: Internal Medicine

## 2019-02-09 DIAGNOSIS — N6310 Unspecified lump in the right breast, unspecified quadrant: Secondary | ICD-10-CM

## 2019-02-09 DIAGNOSIS — N644 Mastodynia: Secondary | ICD-10-CM

## 2019-02-16 ENCOUNTER — Other Ambulatory Visit: Payer: Self-pay | Admitting: Internal Medicine

## 2019-02-16 ENCOUNTER — Other Ambulatory Visit: Payer: Self-pay

## 2019-02-16 ENCOUNTER — Ambulatory Visit
Admission: RE | Admit: 2019-02-16 | Discharge: 2019-02-16 | Disposition: A | Discharge status: Home / Self Care | Payer: BC Managed Care – PPO | Source: Ambulatory Visit | Attending: Internal Medicine | Admitting: Internal Medicine

## 2019-02-16 DIAGNOSIS — Z1231 Encounter for screening mammogram for malignant neoplasm of breast: Secondary | ICD-10-CM

## 2019-03-11 ENCOUNTER — Ambulatory Visit: Payer: BC Managed Care – PPO | Attending: Internal Medicine

## 2019-03-11 DIAGNOSIS — Z23 Encounter for immunization: Secondary | ICD-10-CM | POA: Insufficient documentation

## 2019-03-11 NOTE — Progress Notes (Signed)
   Covid-19 Vaccination Clinic  Name:  Brenda Brewer    MRN: [id number] DOB: 05-30-1960  03/11/2019  [name] was observed post Covid-19 immunization for 15 minutes without incidence. She was provided with Vaccine Information Sheet and instruction to access the V-Safe system.   [name] was instructed to call 911 with any severe reactions post vaccine: Marland Kitchen Difficulty breathing  . Swelling of your face and throat  . A fast heartbeat  . A bad rash all over your body  . Dizziness and weakness    Immunizations Administered    Name Date Dose VIS Date Route   Pfizer COVID-19 Vaccine 03/11/2019  3:39 PM 0.3 mL 01/08/2019 Intramuscular   Manufacturer: [employer], [employer]   Lot: [id number]   NDC: [id number]

## 2019-04-03 ENCOUNTER — Ambulatory Visit: Payer: BC Managed Care – PPO | Attending: Internal Medicine

## 2019-04-03 DIAGNOSIS — Z23 Encounter for immunization: Secondary | ICD-10-CM | POA: Insufficient documentation

## 2019-04-03 NOTE — Progress Notes (Signed)
   Covid-19 Vaccination Clinic  Name:  Brenda Brewer    MRN: [id number] DOB: [birth year]  04/03/2019  Ms. Brenda Brewer was observed post Covid-19 immunization for 15 minutes without incident. She was provided with Vaccine Information Sheet and instruction to access the V-Safe system.   Ms. Brenda Brewer was instructed to call 911 with any severe reactions post vaccine: Marland Kitchen Difficulty breathing  . Swelling of face and throat  . A fast heartbeat  . A bad rash all over body  . Dizziness and weakness   Immunizations Administered    Name Date Dose VIS Date Route   Pfizer COVID-19 Vaccine 04/03/2019  1:23 PM 0.3 mL 01/08/2019 Intramuscular   Manufacturer: [employer], [employer]   Lot: [id number]   NDC: [address]-0

## 2019-10-27 ENCOUNTER — Other Ambulatory Visit: Payer: Self-pay | Admitting: Student

## 2019-10-27 DIAGNOSIS — M4802 Spinal stenosis, cervical region: Secondary | ICD-10-CM

## 2019-11-29 ENCOUNTER — Other Ambulatory Visit: Payer: Self-pay

## 2019-11-29 ENCOUNTER — Ambulatory Visit: Payer: BC Managed Care – PPO

## 2019-12-21 ENCOUNTER — Other Ambulatory Visit: Payer: BC Managed Care – PPO

## 2020-01-24 ENCOUNTER — Inpatient Hospital Stay: Admission: RE | Admit: 2020-01-24 | Payer: BC Managed Care – PPO | Source: Ambulatory Visit

## 2020-02-12 ENCOUNTER — Inpatient Hospital Stay: Admission: RE | Admit: 2020-02-12 | Payer: BC Managed Care – PPO | Source: Ambulatory Visit

## 2020-03-01 ENCOUNTER — Other Ambulatory Visit: Payer: Self-pay | Admitting: Internal Medicine

## 2020-03-01 DIAGNOSIS — Z1231 Encounter for screening mammogram for malignant neoplasm of breast: Secondary | ICD-10-CM

## 2020-04-14 ENCOUNTER — Ambulatory Visit
Admission: RE | Admit: 2020-04-14 | Discharge: 2020-04-14 | Disposition: A | Discharge status: Home / Self Care | Payer: BC Managed Care – PPO | Source: Ambulatory Visit | Attending: Internal Medicine | Admitting: Internal Medicine

## 2020-04-14 ENCOUNTER — Inpatient Hospital Stay: Admission: RE | Admit: 2020-04-14 | Payer: Self-pay | Source: Ambulatory Visit

## 2020-04-14 ENCOUNTER — Other Ambulatory Visit: Payer: Self-pay

## 2020-04-14 DIAGNOSIS — Z1231 Encounter for screening mammogram for malignant neoplasm of breast: Secondary | ICD-10-CM

## 2021-04-17 ENCOUNTER — Other Ambulatory Visit: Payer: Self-pay | Admitting: Physician Assistant

## 2021-04-17 DIAGNOSIS — Z1231 Encounter for screening mammogram for malignant neoplasm of breast: Secondary | ICD-10-CM

## 2021-04-19 ENCOUNTER — Ambulatory Visit: Payer: BC Managed Care – PPO

## 2021-05-01 ENCOUNTER — Ambulatory Visit
Admission: RE | Admit: 2021-05-01 | Discharge: 2021-05-01 | Disposition: A | Discharge status: Home / Self Care | Payer: BC Managed Care – PPO | Source: Ambulatory Visit | Attending: Physician Assistant | Admitting: Physician Assistant

## 2021-05-01 DIAGNOSIS — Z1231 Encounter for screening mammogram for malignant neoplasm of breast: Secondary | ICD-10-CM

## 2021-07-26 ENCOUNTER — Other Ambulatory Visit (HOSPITAL_BASED_OUTPATIENT_CLINIC_OR_DEPARTMENT_OTHER): Payer: Self-pay

## 2021-07-26 MED ORDER — ONDANSETRON 4 MG PO TBDP
ORAL_TABLET | ORAL | 0 refills | Status: AC
  Filled 2021-07-26: qty 18, 6d supply, fill #0

## 2021-07-26 MED ORDER — OMEPRAZOLE 40 MG PO CPDR
DELAYED_RELEASE_CAPSULE | ORAL | 1 refills | Status: AC
  Filled 2021-07-26 (×2): qty 180, 90d supply, fill #0

## 2021-07-26 MED ORDER — HYDROCODONE-ACETAMINOPHEN 10-325 MG PO TABS
ORAL_TABLET | ORAL | 0 refills | Status: AC
  Filled 2021-07-26 (×2): qty 135, 30d supply, fill #0
  Filled 2021-07-27: qty 135, 27d supply, fill #0

## 2021-07-27 ENCOUNTER — Other Ambulatory Visit (HOSPITAL_BASED_OUTPATIENT_CLINIC_OR_DEPARTMENT_OTHER): Payer: Self-pay

## 2021-07-30 ENCOUNTER — Other Ambulatory Visit (HOSPITAL_BASED_OUTPATIENT_CLINIC_OR_DEPARTMENT_OTHER): Payer: Self-pay

## 2022-03-19 ENCOUNTER — Other Ambulatory Visit: Payer: Self-pay | Admitting: Physician Assistant

## 2022-03-19 DIAGNOSIS — Z1231 Encounter for screening mammogram for malignant neoplasm of breast: Secondary | ICD-10-CM

## 2022-05-06 ENCOUNTER — Ambulatory Visit: Payer: BC Managed Care – PPO

## 2022-05-15 ENCOUNTER — Inpatient Hospital Stay: Admission: RE | Admit: 2022-05-15 | Payer: BC Managed Care – PPO | Source: Ambulatory Visit

## 2022-06-19 ENCOUNTER — Ambulatory Visit
Admission: RE | Admit: 2022-06-19 | Discharge: 2022-06-19 | Disposition: A | Discharge status: Home / Self Care | Payer: BC Managed Care – PPO | Source: Ambulatory Visit | Attending: Physician Assistant | Admitting: Physician Assistant

## 2022-06-19 DIAGNOSIS — Z1231 Encounter for screening mammogram for malignant neoplasm of breast: Secondary | ICD-10-CM

## 2022-06-20 ENCOUNTER — Ambulatory Visit: Payer: BC Managed Care – PPO

## 2023-05-21 ENCOUNTER — Other Ambulatory Visit: Payer: Self-pay | Admitting: Physician Assistant

## 2023-05-21 DIAGNOSIS — Z1231 Encounter for screening mammogram for malignant neoplasm of breast: Secondary | ICD-10-CM

## 2023-06-20 ENCOUNTER — Ambulatory Visit: Payer: Self-pay

## 2023-06-30 ENCOUNTER — Ambulatory Visit
Admission: RE | Admit: 2023-06-30 | Discharge: 2023-06-30 | Disposition: A | Discharge status: Home / Self Care | Payer: Self-pay | Source: Ambulatory Visit | Attending: Physician Assistant | Admitting: Physician Assistant

## 2023-06-30 DIAGNOSIS — Z1231 Encounter for screening mammogram for malignant neoplasm of breast: Secondary | ICD-10-CM

## 2023-08-19 IMAGING — MG MM DIGITAL SCREENING BILAT W/ TOMO AND CAD
6 of 10 series · 6 of 30 positions shown · non-contrast
Comparison: Previous exam(s).

CLINICAL DATA: Screening.

EXAM:
DIGITAL SCREENING BILATERAL MAMMOGRAM WITH TOMOSYNTHESIS AND CAD
TECHNIQUE: Bilateral screening digital craniocaudal and mediolateral oblique
mammograms were obtained. Bilateral screening digital breast
tomosynthesis was performed. The images were evaluated with
computer-aided detection.

[L MLO synth-2D]
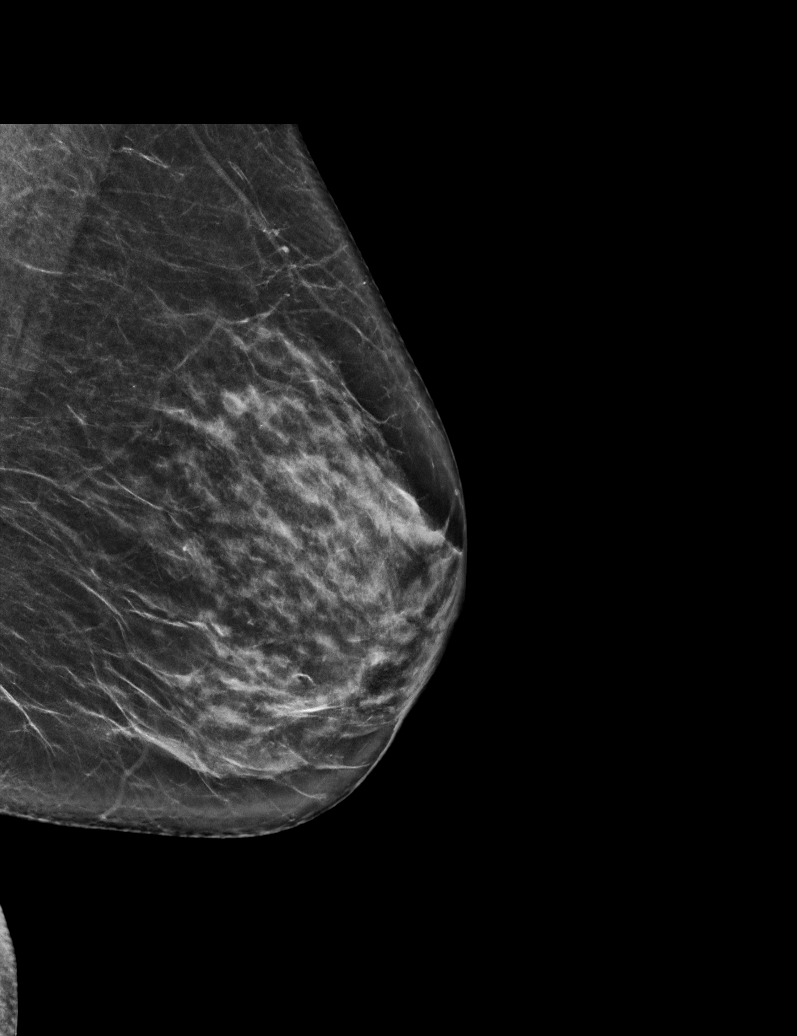

[L CC synth-2D]
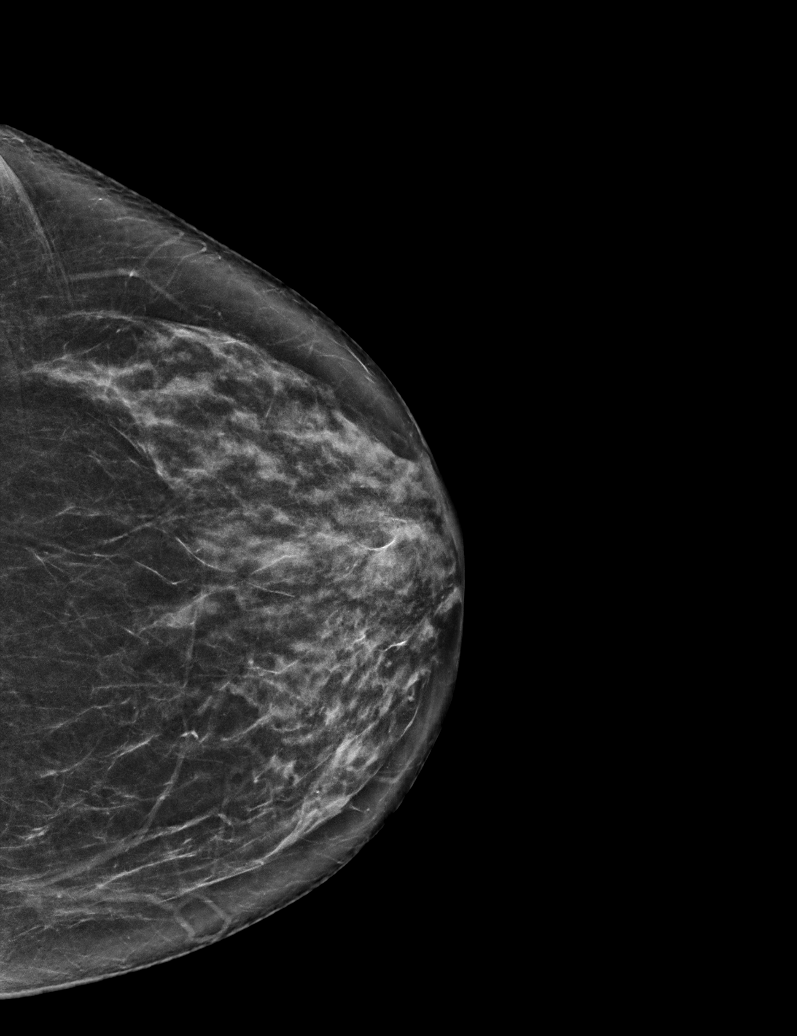

[R CC synth-2D]
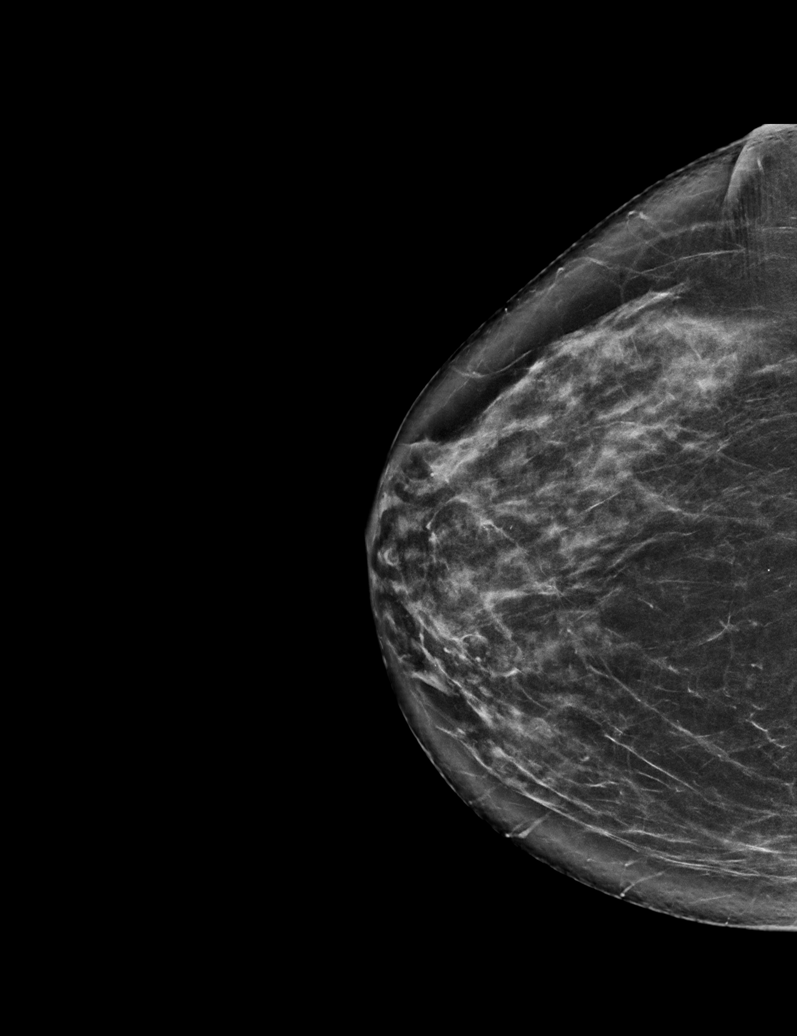

[R MLO synth-2D (1 of 2)]
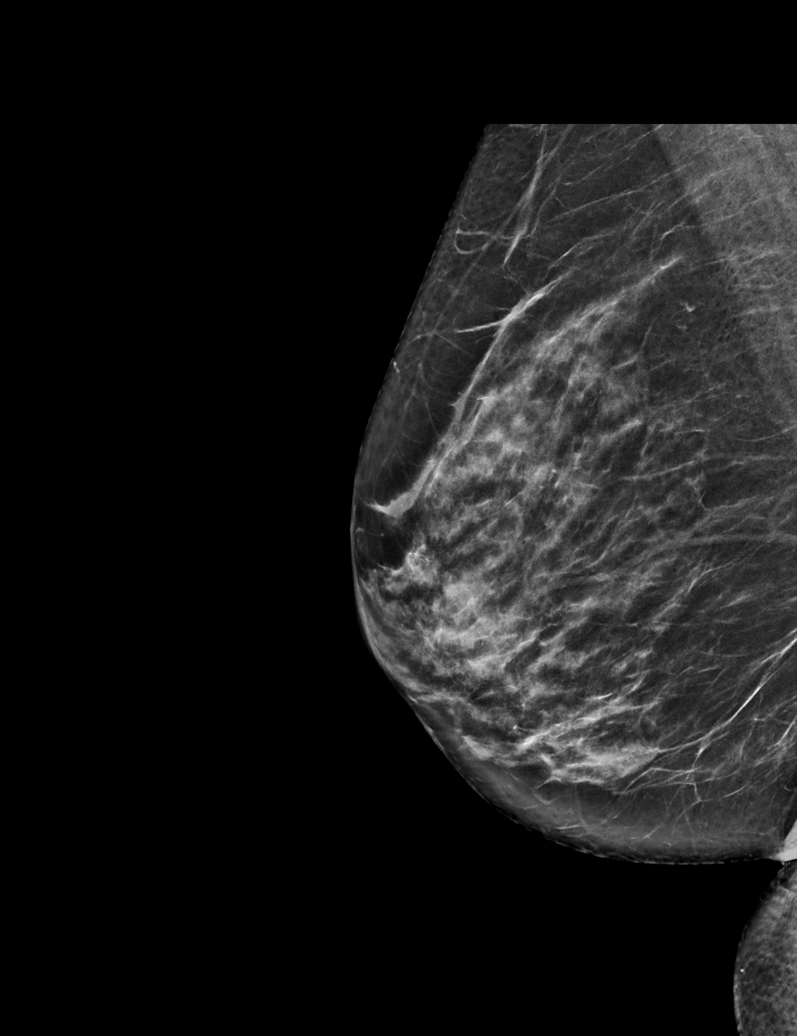

[R MLO synth-2D (2 of 2)]
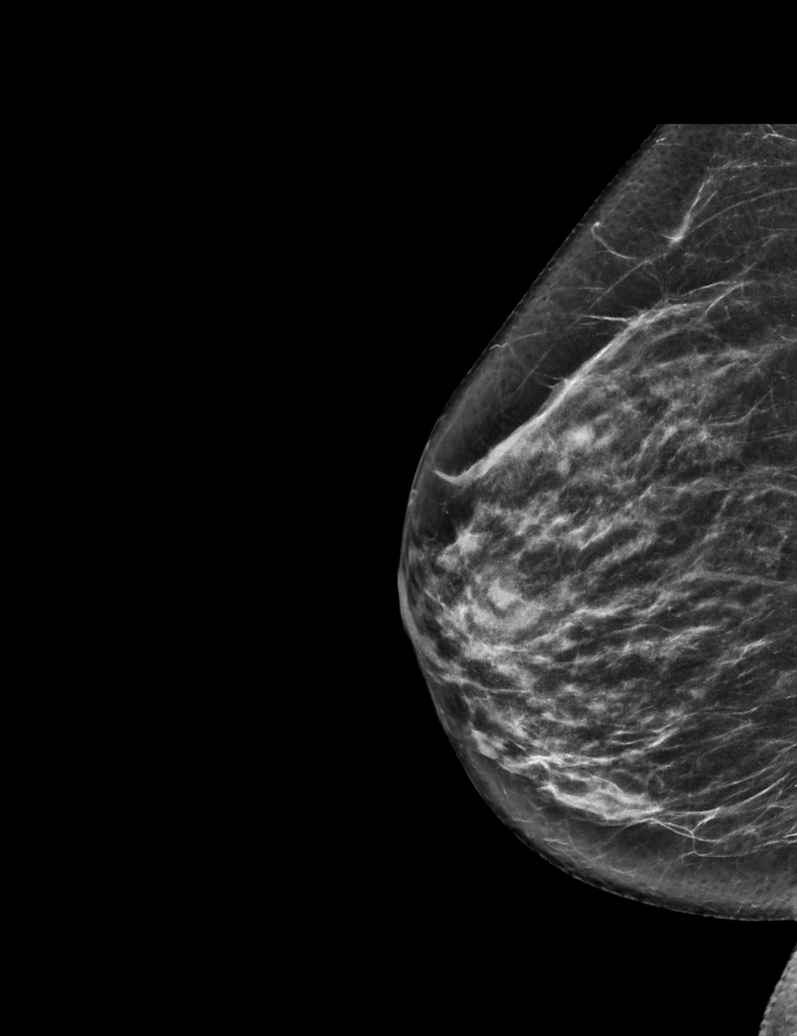

[L CC tomo · tomo slice 31/62.0]
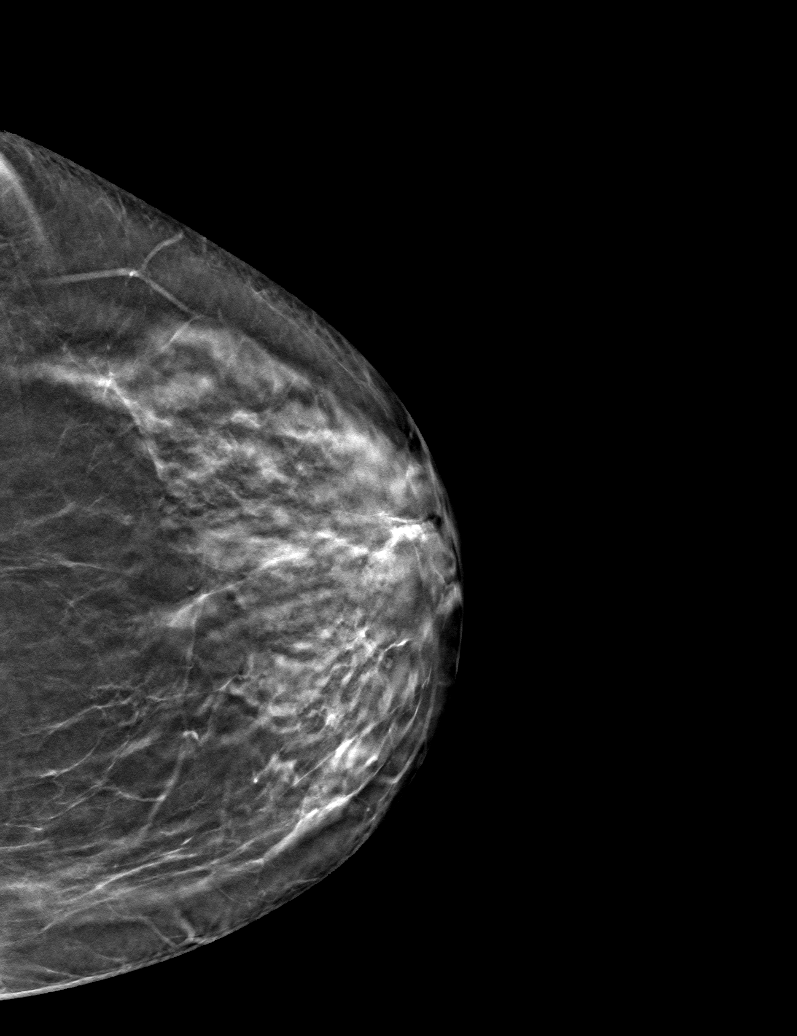

[6 of 30 positions shown; findings below may reference images not displayed]

ACR Breast Density Category c: The breast tissue is heterogeneously
dense, which may obscure small masses.
FINDINGS: There are no findings suspicious for malignancy.
IMPRESSION: No mammographic evidence of malignancy. A result letter of this
screening mammogram will be mailed directly to the patient.

RECOMMENDATION:
Screening mammogram in one year. (Code:Q3-W-BC3)

BI-RADS CATEGORY  1: Negative.
